# Patient Record
Sex: Male | Born: 1985 | Race: Black or African American | Hispanic: No | Marital: Single | Smoking: Current every day smoker
Health system: Southern US, Community
[De-identification: ages and names within clinical notes are randomized; demographics above are authoritative.]

---

## 2008-11-25 ENCOUNTER — Emergency Department: Payer: Self-pay

## 2009-01-27 ENCOUNTER — Emergency Department (HOSPITAL_COMMUNITY): Admission: EM | Admit: 2009-01-27 | Discharge: 2009-01-27 | Payer: Self-pay | Admitting: Emergency Medicine

## 2010-12-15 ENCOUNTER — Emergency Department (HOSPITAL_COMMUNITY)
Admission: EM | Admit: 2010-12-15 | Discharge: 2010-12-15 | Disposition: A | Discharge status: Home / Self Care | Payer: Self-pay | Attending: Emergency Medicine | Admitting: Emergency Medicine

## 2010-12-15 ENCOUNTER — Encounter: Payer: Self-pay | Admitting: *Deleted

## 2010-12-15 DIAGNOSIS — L02411 Cutaneous abscess of right axilla: Secondary | ICD-10-CM

## 2010-12-15 DIAGNOSIS — IMO0002 Reserved for concepts with insufficient information to code with codable children: Secondary | ICD-10-CM | POA: Insufficient documentation

## 2010-12-15 MED ORDER — HYDROCODONE-ACETAMINOPHEN 5-325 MG PO TABS
2.0000 | ORAL_TABLET | Freq: Once | ORAL | Status: AC
  Administered 2010-12-15: 2 via ORAL
  Filled 2010-12-15: qty 2

## 2010-12-15 MED ORDER — LIDOCAINE-EPINEPHRINE 2 %-1:100000 IJ SOLN
20.0000 mL | Freq: Once | INTRAMUSCULAR | Status: AC
  Administered 2010-12-15: 5 mL
  Filled 2010-12-15: qty 1

## 2010-12-15 MED ORDER — DOXYCYCLINE HYCLATE 100 MG PO TABS
100.0000 mg | ORAL_TABLET | Freq: Once | ORAL | Status: AC
  Administered 2010-12-15: 100 mg via ORAL
  Filled 2010-12-15: qty 1

## 2010-12-15 MED ORDER — HYDROCODONE-ACETAMINOPHEN 5-325 MG PO TABS: 2.0000 | ORAL_TABLET | Freq: Four times a day (QID) | ORAL | Status: AC | PRN

## 2010-12-15 MED ORDER — DOXYCYCLINE HYCLATE 100 MG PO CAPS: 100.0000 mg | ORAL_CAPSULE | Freq: Two times a day (BID) | ORAL | Status: AC

## 2010-12-15 NOTE — ED Provider Notes (Signed)
History     CSN: [id number] Arrival date & time: 12/15/2010  6:23 PM   First MD Initiated Contact with Patient 12/15/10 1954      Chief Complaint  Patient presents with  . Abscess    pt has boil under right arm. denies drainage.    (Consider location/radiation/quality/duration/timing/severity/associated sxs/prior treatment) Patient is a 25 y.o. male presenting with abscess. The history is provided by the patient.  Abscess  Chronicity: Began 2 days ago. Affected Location: R axilla.  The problem is moderate. The abscess is characterized by redness and painfulness. Pertinent negatives include no fever. He has received no recent medical care.   Pt notes similar symptoms of the L axilla once in the past.   History reviewed. No pertinent past medical history.  History reviewed. No pertinent past surgical history.  History reviewed. No pertinent family history.  History  Substance Use Topics  . Smoking status: Current Everyday Smoker    Types: Cigarettes  . Smokeless tobacco: Not on file  . Alcohol Use: Yes     occ      Review of Systems  Constitutional: Negative for fever.  All other systems reviewed and are negative.    Allergies  Sulfa antibiotics  Home Medications  No current outpatient prescriptions on file.  BP 126/56  Pulse 67  Temp(Src) 97.7 F (36.5 C) (Oral)  Resp 16  Wt 250 lb (113.399 kg)  SpO2 99%  Physical Exam  Constitutional: He is oriented to person, place, and time. He appears well-developed and well-nourished.  HENT:  Head: Normocephalic and atraumatic.  Musculoskeletal: Normal range of motion.  Neurological: He is alert and oriented to person, place, and time.  Skin: Skin is warm and dry.       2-3 cm fluctuant abscess of the R axilla, tender, surrounding erythema.  Erythema is distal to the abscess.     ED Course  INCISION AND DRAINAGE Performed by: Trinna Balloon Authorized by: Trinna Balloon Consent: Verbal consent  obtained. Risks and benefits: risks, benefits and alternatives were discussed Consent given by: patient Patient understanding: patient states understanding of the procedure being performed Patient consent: the patient's understanding of the procedure matches consent given Procedure consent: procedure consent matches procedure scheduled Relevant documents: relevant documents present and verified Test results: test results available and properly labeled Site marked: the operative site was marked Imaging studies: imaging studies not available Patient identity confirmed: verbally with patient Type: abscess Body area: upper extremity (R axilla) Anesthesia: local infiltration Local anesthetic: lidocaine 2% with epinephrine Anesthetic total (ml): 5 ml. Patient sedated: no Scalpel size: 11 Incision type: single straight Complexity: simple Drainage: purulent Drainage amount: copious Wound treatment: wound left open and drain placed Packing material: 1/4 in iodoform gauze Patient tolerance: Patient tolerated the procedure well with no immediate complications.   (including critical care time)  Labs Reviewed - No data to display No results found.   No diagnosis found.    MDM  Norco 10 mg po, doxycycline 100 mg po.    Pt understands d/c instructions.         Achilles Dunk [place], [place] 12/15/10 2055

## 2010-12-16 NOTE — ED Provider Notes (Signed)
Medical screening examination/treatment/procedure(s) were performed by non-physician practitioner and as supervising physician I was immediately available for consultation/collaboration.  Raeford Razor, MD 12/16/10 0100

## 2015-05-01 ENCOUNTER — Emergency Department (HOSPITAL_COMMUNITY)
Admission: EM | Admit: 2015-05-01 | Discharge: 2015-05-01 | Disposition: A | Discharge status: Home / Self Care | Payer: Self-pay | Attending: Emergency Medicine | Admitting: Emergency Medicine

## 2015-05-01 ENCOUNTER — Encounter (HOSPITAL_COMMUNITY): Payer: Self-pay | Admitting: Emergency Medicine

## 2015-05-01 DIAGNOSIS — F17210 Nicotine dependence, cigarettes, uncomplicated: Secondary | ICD-10-CM | POA: Insufficient documentation

## 2015-05-01 DIAGNOSIS — J020 Streptococcal pharyngitis: Secondary | ICD-10-CM | POA: Insufficient documentation

## 2015-05-01 LAB — RAPID STREP SCREEN (MED CTR MEBANE ONLY): Streptococcus, Group A Screen (Direct): POSITIVE — AB

## 2015-05-01 MED ORDER — DEXAMETHASONE 10 MG/ML FOR PEDIATRIC ORAL USE
10.0000 mg | Freq: Once | INTRAMUSCULAR | Status: AC
  Administered 2015-05-01: 10 mg via ORAL
  Filled 2015-05-01: qty 1

## 2015-05-01 MED ORDER — PENICILLIN G BENZATHINE 1200000 UNIT/2ML IM SUSP
1.2000 10*6.[IU] | Freq: Once | INTRAMUSCULAR | Status: AC
  Administered 2015-05-01: 1.2 10*6.[IU] via INTRAMUSCULAR
  Filled 2015-05-01: qty 2

## 2015-05-01 MED ORDER — HYDROCODONE-ACETAMINOPHEN 7.5-325 MG/15ML PO SOLN: 10.0000 mL | Freq: Four times a day (QID) | ORAL | Status: AC | PRN

## 2015-05-01 MED ORDER — DEXAMETHASONE 1 MG/ML PO CONC
10.0000 mg | Freq: Once | ORAL | Status: DC
  Filled 2015-05-01: qty 10

## 2015-05-01 MED ORDER — HYDROCODONE-ACETAMINOPHEN 7.5-325 MG/15ML PO SOLN
10.0000 mL | Freq: Once | ORAL | Status: AC
  Administered 2015-05-01: 10 mL via ORAL
  Filled 2015-05-01: qty 15

## 2015-05-01 NOTE — ED Provider Notes (Signed)
CSN: [id number]     Arrival date & time 05/01/15  0019 History   First MD Initiated Contact with Patient 05/01/15 0043     Chief Complaint  Patient presents with  . Sore Throat     (Consider location/radiation/quality/duration/timing/severity/associated sxs/prior Treatment) HPI Comments: Otherwise healthy patient presents with sore throat x 1 day. No fever. Remote history of strep throat.  Patient is a 30 y.o. male presenting with pharyngitis. The history is provided by the patient. No language interpreter was used.  Sore Throat This is a new problem. The current episode started yesterday. Associated symptoms include a sore throat and swollen glands. Pertinent negatives include no congestion, coughing, fever, headaches, nausea, neck pain or rash. The symptoms are aggravated by swallowing. He has tried nothing for the symptoms.    History reviewed. No pertinent past medical history. History reviewed. No pertinent past surgical history. No family history on file. Social History  Substance Use Topics  . Smoking status: Current Every Day Smoker    Types: Cigarettes  . Smokeless tobacco: None  . Alcohol Use: Yes     Comment: occ    Review of Systems  Constitutional: Negative for fever.  HENT: Positive for sore throat and trouble swallowing. Negative for congestion and facial swelling.   Respiratory: Negative for cough and shortness of breath.   Gastrointestinal: Negative for nausea.  Musculoskeletal: Negative for neck pain and neck stiffness.  Skin: Negative for rash.  Neurological: Negative for headaches.      Allergies  Sulfa antibiotics  Home Medications   Prior to Admission medications   Not on File   BP 132/70 mmHg  Pulse 79  Temp(Src) 98 F (36.7 C) (Oral)  Resp 19  Ht 6\' 2"  (1.88 m)  Wt 101.47 kg  BMI 28.71 kg/m2  SpO2 94% Physical Exam  Constitutional: He is oriented to person, place, and time. He appears well-developed and well-nourished.  HENT:  Left  tonsil erythematous, swollen without exudates. No peritonsillar swelling. Uvula midline. Right tonsil minimally swollen.   Neck: Normal range of motion. Neck supple.  Pulmonary/Chest: Effort normal.  Musculoskeletal: Normal range of motion.  Lymphadenopathy:    He has cervical adenopathy.  Neurological: He is alert and oriented to person, place, and time.  Skin: Skin is warm and dry.  Psychiatric: He has a normal mood and affect.    ED Course  Procedures (including critical care time) Labs Review Labs Reviewed  RAPID STREP SCREEN (NOT AT [facility]) - Abnormal; Notable for the following:    Streptococcus, Group A Screen (Direct) POSITIVE (*)    All other components within normal limits    Imaging Review No results found. I have personally reviewed and evaluated these images and lab results as part of my medical decision-making.   EKG Interpretation None      MDM   Final diagnoses:  None    1. Strep throat  Bicillin and decadron provided in treatment of strep throat. Patient given Hycet for pain relief. Discussed possible development of a peritonsillar abscess and symptoms that would warrant return to ED.     Elpidio AnisShari Blaze Sandin, PA-C 05/01/15 0134  Dione Boozeavid Glick, MD 05/01/15 [phone]

## 2015-05-01 NOTE — Discharge Instructions (Signed)

## 2015-05-01 NOTE — ED Notes (Signed)
Pt. reports left sore throat with mild swelling onset this morning , denies fever or cough , respirations unlabored / no oral swelling .

## 2015-11-19 ENCOUNTER — Other Ambulatory Visit: Payer: Self-pay

## 2015-11-19 ENCOUNTER — Ambulatory Visit: Payer: Self-pay

## 2015-11-27 ENCOUNTER — Emergency Department (HOSPITAL_COMMUNITY)
Admission: EM | Admit: 2015-11-27 | Discharge: 2015-11-27 | Disposition: A | Discharge status: Home / Self Care | Payer: Self-pay | Attending: Emergency Medicine | Admitting: Emergency Medicine

## 2015-11-27 ENCOUNTER — Encounter (HOSPITAL_COMMUNITY): Payer: Self-pay | Admitting: Emergency Medicine

## 2015-11-27 DIAGNOSIS — H6691 Otitis media, unspecified, right ear: Secondary | ICD-10-CM | POA: Insufficient documentation

## 2015-11-27 DIAGNOSIS — H60501 Unspecified acute noninfective otitis externa, right ear: Secondary | ICD-10-CM | POA: Insufficient documentation

## 2015-11-27 DIAGNOSIS — F17210 Nicotine dependence, cigarettes, uncomplicated: Secondary | ICD-10-CM | POA: Insufficient documentation

## 2015-11-27 MED ORDER — AMOXICILLIN 500 MG PO CAPS: 500.0000 mg | ORAL_CAPSULE | Freq: Three times a day (TID) | ORAL | 0 refills | Status: DC

## 2015-11-27 MED ORDER — NEOMYCIN-POLYMYXIN-HC 1 % OT SOLN
4.0000 [drp] | Freq: Four times a day (QID) | OTIC | Status: DC
  Administered 2015-11-27: 4 [drp] via OTIC
  Filled 2015-11-27: qty 10

## 2015-11-27 NOTE — ED Provider Notes (Signed)
[facility]EM[facility]Y DEPT Provider Note   CSN: [id number] Arrival date & time: 11/27/15  1907  By signing my name below, I, Matthew Bauer, attest that this documentation has been prepared under the direction and in the presence of [facility] Alexes Menchaca, NP-C. Electronically Signed: Phillis HaggisGabriella Bauer, ED Scribe. 11/27/15. 7:45 PM.  History   Chief Complaint Chief Complaint  Patient presents with  . Otalgia   The history is provided by the patient.  Otalgia  This is a new problem. The current episode started yesterday. There is pain in the right ear. The problem occurs constantly. The problem has been gradually worsening. There has been no fever. The pain is mild. Associated symptoms include ear discharge and neck pain. Pertinent negatives include no headaches, no hearing loss, no sore throat, no vomiting and no cough.   HPI Comments: Matthew Bauer is a 30 y.o. male who presents to the Emergency Department complaining of gradually worsening, throbbing right ear pain onset one day ago. Pt reports associated right sided neck pain. He states that he used a Q-tip yesterday and noticed blood. He has not gone swimming recently. He has taken ibuprofen for his pain to mild relief. He denies fever, chills, congestion, sore throat, hearing loss, cough, nausea, vomiting, or headaches.   History reviewed. No pertinent past medical history.  There are no active problems to display for this patient.   History reviewed. No pertinent surgical history.     Home Medications    Prior to Admission medications   Medication Sig Start Date End Date Taking? Authorizing Provider  amoxicillin (AMOXIL) 500 MG capsule Take 1 capsule (500 mg total) by mouth 3 (three) times daily. 11/27/15   Avarose Mervine Orlene OchM Peaches Vanoverbeke, NP  HYDROcodone-acetaminophen (HYCET) 7.5-325 mg/15 ml solution Take 10 mLs by mouth 4 (four) times daily as needed for moderate pain. 05/01/15   Elpidio AnisShari Upstill, PA-C    Family History No family history on file.  Social  History Social History  Substance Use Topics  . Smoking status: Current Every Day Smoker    Types: Cigarettes  . Smokeless tobacco: Not on file  . Alcohol use Yes     Comment: occ     Allergies   Sulfa antibiotics   Review of Systems Review of Systems  Constitutional: Negative for chills and fever.  HENT: Positive for ear discharge and ear pain. Negative for hearing loss and sore throat.   Respiratory: Negative for cough.   Gastrointestinal: Negative for nausea and vomiting.  Musculoskeletal: Positive for neck pain.  Neurological: Negative for headaches.     Physical Exam Updated Vital Signs BP 131/68   Pulse 87   Temp 97.6 F (36.4 C) (Oral)   Resp 16   SpO2 100%   Physical Exam  Constitutional: He is oriented to person, place, and time. He appears well-developed and well-nourished.  HENT:  Head: Normocephalic and atraumatic.  Right Ear: There is swelling. No mastoid tenderness. Tympanic membrane is bulging.  Left Ear: Tympanic membrane and ear canal normal.  Mouth/Throat: Oropharynx is clear and moist. No oropharyngeal exudate.  Right ear canal is erythematous, has swelling, and a bulging TM  Eyes: Conjunctivae are normal.  Neck: Normal range of motion. Neck supple.  Musculoskeletal: Normal range of motion.  Neurological: He is alert and oriented to person, place, and time.  Skin: Skin is warm and dry.  Psychiatric: He has a normal mood and affect. His behavior is normal.  Nursing note and vitals reviewed.  ED Treatments / Results  DIAGNOSTIC  STUDIES: Oxygen Saturation is 100% on RA, normal by my interpretation.    COORDINATION OF CARE: -Discussed treatment plan which includes antibiotics with pt at bedside and pt agreed to plan.    Labs (all labs ordered are listed, but only abnormal results are displayed) Labs Reviewed - No data to display  Radiology No results found.  Procedures Procedures (including critical care time)  Medications Ordered in  ED Medications - No data to display   Initial Impression / Assessment and Plan / ED Course  I have reviewed the triage vital signs and the nursing notes.   Clinical Course    Patient presents with otalgia and exam consistent with acute otitis media and otitis externa. No concern for acute mastoiditis, meningitis.  No antibiotic use in the last month.  Patient discharged home with Amoxicillin.  Ad[facility].  I have also discussed reasons to return immediately to the ER. Patient expresses understanding and agrees with plan.  Final Clinical Impressions(s) / ED Diagnoses   Final diagnoses:  Acute otitis externa of right ear, unspecified type  Acute otitis media, right  I personally performed the services described in this documentation, which was scribed in my presence. The recorded information has been reviewed and is accurate.   New Prescriptions Discharge Medication List as of 11/27/2015  7:47 PM    START taking these medications   Details  amoxicillin (AMOXIL) 500 MG capsule Take 1 capsule (500 mg total) by mouth 3 (three) times daily., Starting Thu 11/27/2015, [address]         Marilouise Densmore EndeavorM Shayde Gervacio, NP 11/28/15 1648    Lyndal Pulleyaniel Knott, MD 12/01/15 1101

## 2015-11-27 NOTE — ED Triage Notes (Signed)
Pt states "i dont know if its something in my ear or and infection but my right ear has been throbbing, and my neck is sore as well". Denies fevers.

## 2015-11-27 NOTE — Discharge Instructions (Signed)
Use the ear drops 4 drops every 6 hours in the right ear.

## 2015-11-27 NOTE — ED Notes (Signed)
Pt stable, ambulatory, states understanding of discharge instructions 

## 2015-11-27 NOTE — ED Notes (Signed)
Waiting of prescription from pharmacy

## 2015-12-01 ENCOUNTER — Emergency Department (HOSPITAL_COMMUNITY)
Admission: EM | Admit: 2015-12-01 | Discharge: 2015-12-01 | Disposition: A | Discharge status: Home / Self Care | Payer: Self-pay | Attending: Emergency Medicine | Admitting: Emergency Medicine

## 2015-12-01 ENCOUNTER — Encounter (HOSPITAL_COMMUNITY): Payer: Self-pay | Admitting: Emergency Medicine

## 2015-12-01 DIAGNOSIS — H6690 Otitis media, unspecified, unspecified ear: Secondary | ICD-10-CM

## 2015-12-01 DIAGNOSIS — F17210 Nicotine dependence, cigarettes, uncomplicated: Secondary | ICD-10-CM | POA: Insufficient documentation

## 2015-12-01 DIAGNOSIS — H6691 Otitis media, unspecified, right ear: Secondary | ICD-10-CM | POA: Insufficient documentation

## 2015-12-01 MED ORDER — AMOXICILLIN-POT CLAVULANATE 875-125 MG PO TABS: 1.0000 | ORAL_TABLET | Freq: Two times a day (BID) | ORAL | 0 refills | Status: AC

## 2015-12-01 NOTE — ED Provider Notes (Signed)
[facility] Provider Note   CSN: [id number] Arrival date & time: 12/01/15  2043  By signing my name below, I, Morene CrockerKevin Le, attest that this documentation has been prepared under the direction and in the presence of Roxy Horsemanobert Nikisha Fleece, PA-C. Electronically Signed: Morene CrockerKevin Le, Scribe. 12/01/15. 9:27 PM.   History   Chief Complaint Chief Complaint  Patient presents with  . Otalgia    The history is provided by the patient. No language interpreter was used.   HPI Comments: Matthew Bauer is a 30 y.o. male who presents to the Emergency Department complaining of moderate right ear pain that began 7 days ago. Pt describes a throbbing sensation. Pt was seen in the ED on 11/27/15 for the same and was discharged with ear drops and antibiotics which have not provided relief. He denies h/o diabetes. He denies fever, sore throat, and rhinorrhea.  History reviewed. No pertinent past medical history.  There are no active problems to display for this patient.   History reviewed. No pertinent surgical history.     Home Medications    Prior to Admission medications   Medication Sig Start Date End Date Taking? Authorizing Provider  amoxicillin (AMOXIL) 500 MG capsule Take 1 capsule (500 mg total) by mouth 3 (three) times daily. 11/27/15   Hope Orlene OchM Neese, NP  HYDROcodone-acetaminophen (HYCET) 7.5-325 mg/15 ml solution Take 10 mLs by mouth 4 (four) times daily as needed for moderate pain. 05/01/15   Elpidio AnisShari Upstill, PA-C    Family History No family history on file.  Social History Social History  Substance Use Topics  . Smoking status: Current Every Day Smoker    Types: Cigarettes  . Smokeless tobacco: Never Used  . Alcohol use Yes     Comment: occ     Allergies   Sulfa antibiotics   Review of Systems Review of Systems  Constitutional: Negative for fever.  HENT: Positive for ear pain (right). Negative for rhinorrhea and sore throat.      Physical Exam Updated Vital Signs Ht  6\' 2"  (1.88 m)   Wt 293 lb (132.9 kg)   BMI 37.62 kg/m   Physical Exam  Constitutional: He is oriented to person, place, and time. He appears well-developed and well-nourished. No distress.  HENT:  Head: Normocephalic and atraumatic.  Right TM is erythematous and congested No mastoid tenderness  Eyes: Conjunctivae are normal.  Cardiovascular: Normal rate.   Pulmonary/Chest: Effort normal.  Abdominal: He exhibits no distension.  Neurological: He is alert and oriented to person, place, and time.  Skin: Skin is warm and dry.  Psychiatric: He has a normal mood and affect.  Nursing note and vitals reviewed.    ED Treatments / Results   COORDINATION OF CARE: 9:25 PM Discussed treatment plan with pt at bedside and pt agreed to plan.   Labs (all labs ordered are listed, but only abnormal results are displayed) Labs Reviewed - No data to display  EKG  EKG Interpretation None       Radiology No results found.  Procedures Procedures (including critical care time)  Medications Ordered in ED Medications - No data to display   Initial Impression / Assessment and Plan / ED Course  I have reviewed the triage vital signs and the nursing notes.  Pertinent labs & imaging results that were available during my care of the patient were reviewed by me and considered in my medical decision making (see chart for details).  Clinical Course     OM failing treatment  with amox.  Will increase to Augmentin.  ENT follow-up if no improvement on this.  Final Clinical Impressions(s) / ED Diagnoses   Final diagnoses:  Acute otitis media, unspecified otitis media type    New Prescriptions Discharge Medication List as of 12/01/2015  9:27 PM    START taking these medications   Details  amoxicillin-clavulanate (AUGMENTIN) 875-125 MG tablet Take 1 tablet by mouth every 12 (twelve) hours., Starting Mon 12/01/2015, Print       I personally performed the services described in this  documentation, which was scribed in my presence. The recorded information has been reviewed and is accurate.       Roxy Horsemanobert Korbyn Vanes, PA-C 12/01/15 2253    Eber HongBrian Miller, MD 12/02/15 2100

## 2015-12-01 NOTE — ED Triage Notes (Signed)
Pt. reports persistent right ear ache onset last week prescribed with antibiotic ear gtt with no relief, denies drainage, fever or hearing loss.

## 2015-12-03 ENCOUNTER — Ambulatory Visit: Payer: Self-pay | Admitting: Internal Medicine

## 2016-04-12 ENCOUNTER — Encounter (HOSPITAL_COMMUNITY): Payer: Self-pay | Admitting: *Deleted

## 2016-04-12 ENCOUNTER — Emergency Department (HOSPITAL_COMMUNITY): Payer: Self-pay

## 2016-04-12 ENCOUNTER — Emergency Department (HOSPITAL_COMMUNITY)
Admission: EM | Admit: 2016-04-12 | Discharge: 2016-04-12 | Disposition: A | Discharge status: Home / Self Care | Payer: Self-pay | Attending: Emergency Medicine | Admitting: Emergency Medicine

## 2016-04-12 DIAGNOSIS — F17210 Nicotine dependence, cigarettes, uncomplicated: Secondary | ICD-10-CM | POA: Insufficient documentation

## 2016-04-12 DIAGNOSIS — R0789 Other chest pain: Secondary | ICD-10-CM | POA: Insufficient documentation

## 2016-04-12 LAB — I-STAT TROPONIN, ED: Troponin i, poc: 0 ng/mL (ref 0.00–0.08)

## 2016-04-12 LAB — BASIC METABOLIC PANEL
ANION GAP: 8 (ref 5–15)
BUN: <5 mg/dL — ABNORMAL LOW (ref 6–20)
CHLORIDE: 105 mmol/L (ref 101–111)
CO2: 26 mmol/L (ref 22–32)
Calcium: 9 mg/dL (ref 8.9–10.3)
Creatinine, Ser: 0.73 mg/dL (ref 0.61–1.24)
GFR calc Af Amer: >60 mL/min (ref >60)
GLUCOSE: 90 mg/dL (ref 65–99)
POTASSIUM: 3.7 mmol/L (ref 3.5–5.1)
Sodium: 139 mmol/L (ref 135–145)

## 2016-04-12 LAB — CBC
HEMATOCRIT: 39.8 % (ref 39.0–52.0)
HEMOGLOBIN: 12.6 g/dL — AB (ref 13.0–17.0)
MCH: 26 pg (ref 26.0–34.0)
MCHC: 31.7 g/dL (ref 30.0–36.0)
MCV: 82.1 fL (ref 78.0–100.0)
Platelets: 330 10*3/uL (ref 150–400)
RBC: 4.85 MIL/uL (ref 4.22–5.81)
RDW: 15.7 % — ABNORMAL HIGH (ref 11.5–15.5)
WBC: 8.1 10*3/uL (ref 4.0–10.5)

## 2016-04-12 MED ORDER — OMEPRAZOLE 20 MG PO CPDR: 20.0000 mg | DELAYED_RELEASE_CAPSULE | Freq: Every day | ORAL | 0 refills | Status: AC

## 2016-04-12 MED ORDER — GI COCKTAIL ~~LOC~~
30.0000 mL | Freq: Once | ORAL | Status: AC
  Administered 2016-04-12: 30 mL via ORAL
  Filled 2016-04-12: qty 30

## 2016-04-12 NOTE — ED Provider Notes (Signed)
[facility]PT Provider Note    By signing my name below, I, Earmon Phoenix, attest that this documentation has been prepared under the direction and in the presence of Lavera Guise, MD. Electronically Signed: Earmon Phoenix, ED Scribe. 04/12/16. 3:34 PM.   History   Chief Complaint Chief Complaint  Patient presents with  . Chest Pain    HPI  Matthew Bauer is a 31 y.o. male who presents to the Emergency Department complaining of centralized/left sided burning, tightening CP that began earlier today while at work. He reports associated SOB, diaphoresis, light headedness and nausea. He states the only thing he consumed prior to going to work was water. He reports eating only two nachos last night but reports having "a couple drinks". He has not taken anything for pain relief. Sitting up and movement increases the pain. He denies alleviating factors. He denies fever, chills, vomiting, pain/swelling in the legs, cough. He reports occasional smoking but not daily. He states his father died of an MI in his late forties.    History reviewed. No pertinent past medical history.  There are no active problems to display for this patient.   History reviewed. No pertinent surgical history.     Home Medications    Prior to Admission medications   Medication Sig Start Date End Date Taking? Authorizing Provider  amoxicillin-clavulanate (AUGMENTIN) 875-125 MG tablet Take 1 tablet by mouth every 12 (twelve) hours. 12/01/15   Roxy Horseman, PA-C  HYDROcodone-acetaminophen (HYCET) 7.5-325 mg/15 ml solution Take 10 mLs by mouth 4 (four) times daily as needed for moderate pain. 05/01/15   Elpidio Anis, PA-C  omeprazole (PRILOSEC) 20 MG capsule Take 1 capsule (20 mg total) by mouth daily. 04/12/16   Lavera Guise, MD    Family History History reviewed. No pertinent family history. Father with MI/CAD in late 67s.   Social History Social History  Substance Use Topics  . Smoking status:  Current Every Day Smoker    Types: Cigarettes  . Smokeless tobacco: Never Used  . Alcohol use Yes     Comment: occ     Allergies   Sulfa antibiotics   Review of Systems Review of Systems 10/14 systems reviewed and are negative other than those stated in the HPI   Physical Exam Updated Vital Signs BP (!) 159/92 (BP Location: Right Arm)   Pulse 84   Temp 98.1 F (36.7 C) (Oral)   Resp 19   Ht  (1.88 m)   Wt 260 lb (117.9 kg)   SpO2 100%   BMI 33.38 kg/m   Physical Exam Physical Exam  Nursing note and vitals reviewed. Constitutional: Well developed, well nourished, non-toxic, and in no acute distress Head: Normocephalic and atraumatic.  Mouth/Throat: Oropharynx is clear and moist.  Neck: Normal range of motion. Neck supple.  Cardiovascular: Normal rate and regular rhythm.   Pulmonary/Chest: Effort normal and breath sounds normal.  Abdominal: Soft. There is no tenderness. There is no rebound and no guarding.  Musculoskeletal: Normal range of motion. No calf tenderness or lower extremity edema. Neurological: Alert, no facial droop, fluent speech, moves all extremities symmetrically Skin: Skin is warm and dry.  Psychiatric: Cooperative   ED Treatments / Results  DIAGNOSTIC STUDIES: Oxygen Saturation is 100% on RA, normal by my interpretation.   COORDINATION OF CARE: 3:17 PM- Informed pt that his resulted labs and CXR are WNL. Reassured pt that his physical exam is normal. Will order GI cocktail. Pt verbalizes understanding and agrees to  plan.  Medications  gi cocktail (Maalox,Lidocaine,Donnatal) (30 mLs Oral Given 04/12/16 1522)   Labs (all labs ordered are listed, but only abnormal results are displayed) Labs Reviewed  BASIC METABOLIC PANEL - Abnormal; Notable for the following:       Result Value   BUN <5 (*)    All other components within normal limits  CBC - Abnormal; Notable for the following:    Hemoglobin 12.6 (*)    RDW 15.7 (*)    All other  components within normal limits  I-STAT TROPOININ, ED    EKG  EKG Interpretation  Date/Time:  Monday April 12 2016 12:55:47 EDT Ventricular Rate:  78 PR Interval:  136 QRS Duration: 100 QT Interval:  370 QTC Calculation: 421 R Axis:   38 Text Interpretation:  Normal sinus rhythm Normal ECG no prior EKG  Confirmed by Jawaan Adachi MD, Jalesa Thien [phone]) on 04/12/2016 3:10:48 PM       Radiology Dg Chest 2 View  Result Date: 04/12/2016 CLINICAL DATA:  Chest tightness, shortness of breath, nausea EXAM: CHEST  2 VIEW COMPARISON:  None. FINDINGS: No pneumonia or effusion is seen. Mediastinal and hilar contours are unremarkable. The heart is within normal limits in size. No bony abnormality is seen. IMPRESSION: No active cardiopulmonary disease. Electronically Signed   By: Dwyane Dee M.D.   On: 04/12/2016 13:47    Procedures Procedures (including critical care time)  Medications Ordered in ED Medications  gi cocktail (Maalox,Lidocaine,Donnatal) (30 mLs Oral Given 04/12/16 1522)     Initial Impression / Assessment and Plan / ED Course  I have reviewed the triage vital signs and the nursing notes.  Pertinent labs & imaging results that were available during my care of the patient were reviewed by me and considered in my medical decision making (see chart for details).     31 year old male who presents with left sided burning chest pain since this morning. Non-toxic and in no acute distress. Normal vital signs. Suspect potential reflux/indigestion as cause of symptoms. Low risk for ACS, with negative troponin and normal EKG. CXR visualized and shows no acute cardiopulmonary processes, such as pneumonia or edema. Atypical symptoms for PE, and PERC negative. Symptoms not concerning for dissection or other serious cardiopulmonary/intrathoracic etiologies.  He received GI cocktail, feels improved. Will discharge home with prilosec. Strict return and follow-up instructions reviewed. He expressed  understanding of all discharge instructions and felt comfortable with the plan of care.   Final Clinical Impressions(s) / ED Diagnoses   Final diagnoses:  Atypical chest pain    New Prescriptions New Prescriptions   OMEPRAZOLE (PRILOSEC) 20 MG CAPSULE    Take 1 capsule (20 mg total) by mouth daily.    I personally performed the services described in this documentation, which was scribed in my presence. The recorded information has been reviewed and is accurate.      Lavera Guise, MD 04/12/16 2151

## 2016-04-12 NOTE — ED Triage Notes (Signed)
To ED via POV for eval of chest tightness and sob since just pta. Pt states he was sitting at work and this burning feeling and tightness started. States he would feel better if he could catch his breath. Diaphoresis and nausea accompanied this pain.

## 2016-04-12 NOTE — Discharge Instructions (Signed)
Your heart and lung work up is reassuring today. The burning pain may be related to reflux/indigestion from your stomach.  Return for worsening symptoms, including difficulty breathing, passing out, worsening pain, or any other symptoms concerning to you.

## 2016-12-01 ENCOUNTER — Emergency Department (HOSPITAL_COMMUNITY): Payer: Self-pay

## 2016-12-01 ENCOUNTER — Other Ambulatory Visit: Payer: Self-pay

## 2016-12-01 ENCOUNTER — Encounter (HOSPITAL_COMMUNITY): Payer: Self-pay

## 2016-12-01 ENCOUNTER — Emergency Department (HOSPITAL_COMMUNITY)
Admission: EM | Admit: 2016-12-01 | Discharge: 2016-12-01 | Disposition: A | Discharge status: Home / Self Care | Payer: Self-pay | Attending: Emergency Medicine | Admitting: Emergency Medicine

## 2016-12-01 DIAGNOSIS — F17210 Nicotine dependence, cigarettes, uncomplicated: Secondary | ICD-10-CM | POA: Insufficient documentation

## 2016-12-01 DIAGNOSIS — S0083XA Contusion of other part of head, initial encounter: Secondary | ICD-10-CM | POA: Insufficient documentation

## 2016-12-01 DIAGNOSIS — Y999 Unspecified external cause status: Secondary | ICD-10-CM | POA: Insufficient documentation

## 2016-12-01 DIAGNOSIS — T07XXXA Unspecified multiple injuries, initial encounter: Secondary | ICD-10-CM

## 2016-12-01 DIAGNOSIS — S1083XA Contusion of other specified part of neck, initial encounter: Secondary | ICD-10-CM | POA: Insufficient documentation

## 2016-12-01 DIAGNOSIS — Z23 Encounter for immunization: Secondary | ICD-10-CM | POA: Insufficient documentation

## 2016-12-01 DIAGNOSIS — Y9389 Activity, other specified: Secondary | ICD-10-CM | POA: Insufficient documentation

## 2016-12-01 DIAGNOSIS — Y92414 Local residential or business street as the place of occurrence of the external cause: Secondary | ICD-10-CM | POA: Insufficient documentation

## 2016-12-01 DIAGNOSIS — S0101XA Laceration without foreign body of scalp, initial encounter: Secondary | ICD-10-CM | POA: Insufficient documentation

## 2016-12-01 DIAGNOSIS — Z79899 Other long term (current) drug therapy: Secondary | ICD-10-CM | POA: Insufficient documentation

## 2016-12-01 DIAGNOSIS — S61011A Laceration without foreign body of right thumb without damage to nail, initial encounter: Secondary | ICD-10-CM | POA: Insufficient documentation

## 2016-12-01 MED ORDER — TETANUS-DIPHTH-ACELL PERTUSSIS 5-2.5-18.5 LF-MCG/0.5 IM SUSP
0.5000 mL | Freq: Once | INTRAMUSCULAR | Status: AC
  Administered 2016-12-01: 0.5 mL via INTRAMUSCULAR
  Filled 2016-12-01: qty 0.5
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
## 2016-12-01 NOTE — Discharge Instructions (Signed)
Take ibuprofen or Tylenol for pain.  Ice your bruised area on your scalp.  Apply triple anabiotic ointment to the lacerations on your head and her thumb.  Keep your thumb elevated.  Follow-up with family doctor as needed for recheck.  Return if worsening symptoms.
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
